# Patient Record
Sex: Female | Born: 2000 | Race: White | Hispanic: No | Marital: Single | Smoking: Never smoker
Health system: Southern US, Community
[De-identification: ages and names within clinical notes are randomized; demographics above are authoritative.]

## PROBLEM LIST (undated history)

## (undated) DIAGNOSIS — J111 Influenza due to unidentified influenza virus with other respiratory manifestations: Secondary | ICD-10-CM

---
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
## 2018-04-07 ENCOUNTER — Encounter (HOSPITAL_COMMUNITY): Payer: Self-pay | Admitting: Emergency Medicine

## 2018-04-07 ENCOUNTER — Ambulatory Visit (HOSPITAL_COMMUNITY)
Admission: EM | Admit: 2018-04-07 | Discharge: 2018-04-07 | Disposition: A | Discharge status: Home / Self Care | Payer: No Typology Code available for payment source | Attending: Emergency Medicine | Admitting: Emergency Medicine

## 2018-04-07 ENCOUNTER — Other Ambulatory Visit: Payer: Self-pay

## 2018-04-07 DIAGNOSIS — K644 Residual hemorrhoidal skin tags: Secondary | ICD-10-CM

## 2018-04-07 MED ORDER — POLYETHYLENE GLYCOL 3350 17 G PO PACK: 17.0000 g | PACK | Freq: Every day | ORAL | 0 refills | Status: DC

## 2018-04-07 MED ORDER — HYDROCORTISONE 2.5 % RE CREA: TOPICAL_CREAM | RECTAL | 0 refills | Status: DC

## 2018-04-07 NOTE — Discharge Instructions (Signed)
External hemorrhoid seen on exam. Start miralax to soften stool and help with better bowel movement. Anusol as needed. Keep hydrated, your urine should be clear to pale yellow in color. Follow up with PCP if symptoms not improving for reevaluation. If experiencing worsening bleeding, weakness, dizziness, lightheadedness, go to the emergency department for further evaluation needed.

## 2018-04-07 NOTE — ED Triage Notes (Signed)
[facility] a complaint of rectal bleeding x 4 months that she believed to be a hemorrhoid.

## 2018-04-07 NOTE — ED Provider Notes (Signed)
[facility]    CSN: [id number] Arrival date & time: 04/07/18  1628     History   Chief Complaint Chief Complaint  Patient presents with  . Rectal Bleeding    HPI Lysle Dingwallbigail Pikus is a 17 y.o. female.   17 year old female comes in for 4 months history of rectal bleeding.  States she came in today because [place] 2 [place], and was told to come in.  Denies any worsening of symptoms.  States that she has bright red blood in the toilet bowl every time she has a bowel movement, with some blood with wiping.  States some pain with bowel movement, and usually with hard round stools.  She has bowel movement about 2-3 times a week.  Denies abdominal pain, nausea, vomiting.  Denies history of irritable bowel.  Denies weakness, dizziness, syncope, lightheadedness.  Denies alcohol use, chronic NSAID use.  Has not done anything for the symptoms.     Past Medical History:  Diagnosis Date  . Influenza     There are no active problems to display for this patient.   History reviewed. No pertinent surgical history.  OB History   None      Home Medications    Prior to Admission medications   Medication Sig Start Date End Date Taking? Authorizing Provider  hydrocortisone (ANUSOL-HC) 2.5 % rectal cream Apply rectally 2 times daily 04/07/18   Cathie HoopsYu, Jeweline Reif V, PA-C  polyethylene glycol (MIRALAX) packet Take 17 g by mouth daily. 04/07/18   Belinda FisherYu, Khiree Bukhari V, PA-C    Family History History reviewed. No pertinent family history.  Social History Social History   Tobacco Use  . Smoking status: Never Smoker  Substance Use Topics  . Alcohol use: No  . Drug use: No     Allergies   Patient has no known allergies.   Review of Systems Review of Systems  Reason unable to perform ROS: See HPI as above.     Physical Exam Triage Vital Signs ED Triage Vitals  Enc Vitals Group     BP 04/07/18 1637 122/78     Pulse Rate 04/07/18 1637 81     Resp 04/07/18  1637 18     Temp 04/07/18 1637 98.3 F (36.8 C)     Temp Source 04/07/18 1637 Oral     SpO2 04/07/18 1637 100 %     Weight --      Height --      Head Circumference --      Peak Flow --      Pain Score 04/07/18 1638 1     Pain Loc --      Pain Edu? --      Excl. in GC? --    No data found.  Updated Vital Signs BP 122/78 (BP Location: Right Arm)   Pulse 81   Temp 98.3 F (36.8 C) (Oral)   Resp 18   LMP 03/24/2018   SpO2 100%   Physical Exam  Constitutional: She is oriented to person, place, and time. She appears well-developed and well-nourished. No distress.  HENT:  Head: Normocephalic and atraumatic.  Mouth/Throat: Uvula is midline, oropharynx is clear and moist and mucous membranes are normal. Mucous membranes are not pale.  Eyes: Pupils are equal, round, and reactive to light. Conjunctivae are normal.  Genitourinary:  Genitourinary Comments: Nonthrombosed external hemorrhoids noted on exam. Rectal tone normal. Mild tenderness to palpation at 6 o'clock region. No  obvious internal hemorrhoids felt. No internal hemorrhoids visualized with bearing down.   Neurological: She is alert and oriented to person, place, and time.  Skin: Skin is warm and dry. No pallor.     UC Treatments / Results  Labs (all labs ordered are listed, but only abnormal results are displayed) Labs Reviewed - No data to display  EKG None  Radiology No results found.  Procedures Procedures (including critical care time)  Medications Ordered in UC Medications - No data to display  Initial Impression / Assessment and Plan / UC Course  I have reviewed the triage vital signs and the nursing notes.  Pertinent labs & imaging results that were available during my care of the patient were reviewed by me and considered in my medical decision making (see chart for details).    Nonthrombosed external hemorrhoids. Will have patient use anusol as needed. miralax to soften stool. Information on  hemorrhoids provided. Return precautions given. Patient expresses understanding and agrees to plan.  Final Clinical Impressions(s) / UC Diagnoses   Final diagnoses:  External hemorrhoid    ED Prescriptions    Medication Sig Dispense Auth. Provider   hydrocortisone (ANUSOL-HC) 2.5 % rectal cream Apply rectally 2 times daily 28.35 g Fadia Marlar V, PA-C   polyethylene glycol (MIRALAX) packet Take 17 g by mouth daily. [address] each Threasa Alpha, PA-C 04/07/18 1749

## 2018-04-30 ENCOUNTER — Ambulatory Visit (HOSPITAL_COMMUNITY)
Admission: EM | Admit: 2018-04-30 | Discharge: 2018-04-30 | Disposition: A | Discharge status: Home / Self Care | Payer: No Typology Code available for payment source | Attending: Family Medicine | Admitting: Family Medicine

## 2018-04-30 ENCOUNTER — Other Ambulatory Visit: Payer: Self-pay

## 2018-04-30 ENCOUNTER — Encounter (HOSPITAL_COMMUNITY): Payer: Self-pay | Admitting: Emergency Medicine

## 2018-04-30 DIAGNOSIS — Z3202 Encounter for pregnancy test, result negative: Secondary | ICD-10-CM

## 2018-04-30 DIAGNOSIS — Z1389 Encounter for screening for other disorder: Secondary | ICD-10-CM

## 2018-04-30 DIAGNOSIS — K219 Gastro-esophageal reflux disease without esophagitis: Secondary | ICD-10-CM

## 2018-04-30 LAB — POCT URINALYSIS DIP (DEVICE)
Bilirubin Urine: NEGATIVE
Glucose, UA: NEGATIVE mg/dL
Hgb urine dipstick: NEGATIVE
Ketones, ur: NEGATIVE mg/dL
Leukocytes, UA: NEGATIVE
Nitrite: NEGATIVE
Protein, ur: NEGATIVE mg/dL
Specific Gravity, Urine: 1.015 (ref 1.005–1.030)
Urobilinogen, UA: 0.2 mg/dL (ref 0.0–1.0)
pH: 8 (ref 5.0–8.0)

## 2018-04-30 LAB — POCT PREGNANCY, URINE: Preg Test, Ur: NEGATIVE

## 2018-04-30 MED ORDER — OMEPRAZOLE 20 MG PO CPDR: 20.0000 mg | DELAYED_RELEASE_CAPSULE | Freq: Every day | ORAL | 1 refills | Status: DC

## 2018-04-30 NOTE — Discharge Instructions (Signed)
Call Dr. Russella DarStark for appointment.

## 2018-04-30 NOTE — ED Provider Notes (Signed)
[facility]   [id number] 04/30/18 Arrival Time: 1548   SUBJECTIVE:  Kathleen Lam is a 17 y.o. female who presents to the urgent care with complaint of Intermittent vomiting for 2 months. Patient has vomited multiple times this week.vomiting usually follows a meal and may be 4-5 episodes of vomiting post- meal  Patient denies abdominal pain or nausea.  The emesis is preceded by eating.  She gets a sense of needing to burp, and a mouthful of food comes up with the gas  She tried eliminating dairy which did not help.  She has not tried antacids.  Denies possibility of pregancy and is having her menses now.  [employer] student in communications.  Past Medical History:  Diagnosis Date  . Influenza    Family History  Problem Relation Age of Onset  . Healthy Mother   . Healthy Father    Social History   Socioeconomic History  . Marital status: Single    Spouse name: Not on file  . Number of children: Not on file  . Years of education: Not on file  . Highest education level: Not on file  Occupational History  . Not on file  Social Needs  . Financial resource strain: Not on file  . Food insecurity:    Worry: Not on file    Inability: Not on file  . Transportation needs:    Medical: Not on file    Non-medical: Not on file  Tobacco Use  . Smoking status: Never Smoker  Substance and Sexual Activity  . Alcohol use: No  . Drug use: No  . Sexual activity: Never    Birth control/protection: None  Lifestyle  . Physical activity:    Days per week: Not on file    Minutes per session: Not on file  . Stress: Not on file  Relationships  . Social connections:    Talks on phone: Not on file    Gets together: Not on file    Attends religious service: Not on file    Active member of club or organization: Not on file    Attends meetings of clubs or organizations: Not on file    Relationship status: Not on file  . Intimate partner violence:    Fear of current or ex  partner: Not on file    Emotionally abused: Not on file    Physically abused: Not on file    Forced sexual activity: Not on file  Other Topics Concern  . Not on file  Social History Narrative  . Not on file   No outpatient medications have been marked as taking for the 04/30/18 encounter [facility] Encounter).   No Known Allergies    ROS: As per HPI, remainder of ROS negative.   OBJECTIVE:   Vitals:   04/30/18 1615  BP: 108/79  Pulse: 90  Resp: 18  Temp: 98.4 F (36.9 C)  TempSrc: Oral  SpO2: 97%     General appearance: alert; no distress; cheerful as though nothing is wrong Eyes: PERRL; EOMI; conjunctiva normal HENT: normocephalic; atraumatic;oral mucosa normal Neck: supple Lungs: clear to auscultation bilaterally Heart: regular rate and rhythm Abdomen: soft, non-tender; bowel sounds normal; no masses or organomegaly; no guarding or rebound tenderness Back: no CVA tenderness Extremities: no cyanosis or edema; symmetrical with no gross deformities Skin: warm and dry Neurologic: normal gait; grossly normal Psychological: alert and cooperative; normal mood and affect      Labs:  Results for orders placed or performed during  the hospital encounter of 04/30/18  POCT urinalysis dip (device)  Result Value Ref Range   Glucose, UA NEGATIVE NEGATIVE mg/dL   Bilirubin Urine NEGATIVE NEGATIVE   Ketones, ur NEGATIVE NEGATIVE mg/dL   Specific Gravity, Urine 1.015 1.005 - 1.030   Hgb urine dipstick NEGATIVE NEGATIVE   pH 8.0 5.0 - 8.0   Protein, ur NEGATIVE NEGATIVE mg/dL   Urobilinogen, UA 0.2 0.0 - 1.0 mg/dL   Nitrite NEGATIVE NEGATIVE   Leukocytes, UA NEGATIVE NEGATIVE  Pregnancy, urine POC  Result Value Ref Range   Preg Test, Ur NEGATIVE NEGATIVE    Labs Reviewed  POCT URINALYSIS DIP (DEVICE)  POCT PREGNANCY, URINE    No results found.     ASSESSMENT & PLAN:  1. Gastroesophageal reflux disease, esophagitis presence not specified     Meds ordered  this encounter  Medications  . omeprazole (PRILOSEC) 20 MG capsule    Sig: Take 1 capsule (20 mg total) by mouth daily.    Dispense:  30 capsule    Refill:  1    Reviewed expectations re: course of current medical issues. Questions answered. Outlined signs and symptoms indicating need for more acute intervention. Patient verbalized understanding. After Visit Summary given.    Procedures:      Elvina SidleLauenstein, Melat Wrisley, MD 04/30/18 [phone]

## 2018-04-30 NOTE — ED Triage Notes (Signed)
Intermittent vomiting for 2 months. Patient has vomited multiple times this week.vomiting usually follows a meal and may be 4-5 episodes of vomiting post- meal

## 2018-04-30 NOTE — ED Notes (Signed)
Placed urine specimen in lab 

## 2018-05-12 ENCOUNTER — Other Ambulatory Visit: Payer: Self-pay

## 2018-05-12 ENCOUNTER — Encounter (HOSPITAL_COMMUNITY): Payer: Self-pay | Admitting: Emergency Medicine

## 2018-05-12 ENCOUNTER — Emergency Department (HOSPITAL_COMMUNITY)
Admission: EM | Admit: 2018-05-12 | Discharge: 2018-05-13 | Disposition: A | Discharge status: Home / Self Care | Payer: No Typology Code available for payment source | Attending: Emergency Medicine | Admitting: Emergency Medicine

## 2018-05-12 DIAGNOSIS — Z79899 Other long term (current) drug therapy: Secondary | ICD-10-CM | POA: Diagnosis not present

## 2018-05-12 DIAGNOSIS — R112 Nausea with vomiting, unspecified: Secondary | ICD-10-CM | POA: Diagnosis not present

## 2018-05-12 DIAGNOSIS — K8051 Calculus of bile duct without cholangitis or cholecystitis with obstruction: Secondary | ICD-10-CM | POA: Insufficient documentation

## 2018-05-12 DIAGNOSIS — K8050 Calculus of bile duct without cholangitis or cholecystitis without obstruction: Secondary | ICD-10-CM

## 2018-05-12 DIAGNOSIS — R109 Unspecified abdominal pain: Secondary | ICD-10-CM | POA: Diagnosis present

## 2018-05-12 NOTE — ED Triage Notes (Signed)
Patient presents complaining of emesis and abdominal pain after eating after the last few months. Patient seen end of June for same. Patient has a GI appointment scheduled for next Friday but her friend felt she needed to be seen. Emesis is sporadic, no patterns of vomiting after certain foods.

## 2018-05-13 LAB — CBC
HCT: 36 % (ref 36.0–46.0)
Hemoglobin: 11.3 g/dL — ABNORMAL LOW (ref 12.0–15.0)
MCH: 24.6 pg — ABNORMAL LOW (ref 26.0–34.0)
MCHC: 31.4 g/dL (ref 30.0–36.0)
MCV: 78.3 fL (ref 78.0–100.0)
Platelets: 384 10*3/uL (ref 150–400)
RBC: 4.6 MIL/uL (ref 3.87–5.11)
RDW: 14.2 % (ref 11.5–15.5)
WBC: 5.8 10*3/uL (ref 4.0–10.5)

## 2018-05-13 LAB — COMPREHENSIVE METABOLIC PANEL
ALT: 11 U/L (ref 0–44)
AST: 20 U/L (ref 15–41)
Albumin: 4.4 g/dL (ref 3.5–5.0)
Alkaline Phosphatase: 60 U/L (ref 38–126)
Anion gap: 10 (ref 5–15)
BUN: 11 mg/dL (ref 6–20)
CO2: 23 mmol/L (ref 22–32)
Calcium: 9.2 mg/dL (ref 8.9–10.3)
Chloride: 107 mmol/L (ref 98–111)
Creatinine, Ser: 0.6 mg/dL (ref 0.44–1.00)
GFR calc Af Amer: >60 mL/min (ref >60)
GFR calc non Af Amer: >60 mL/min (ref >60)
Glucose, Bld: 82 mg/dL (ref 70–99)
Potassium: 4.1 mmol/L (ref 3.5–5.1)
Sodium: 140 mmol/L (ref 135–145)
Total Bilirubin: 0.4 mg/dL (ref 0.3–1.2)
Total Protein: 7.6 g/dL (ref 6.5–8.1)

## 2018-05-13 LAB — URINALYSIS, ROUTINE W REFLEX MICROSCOPIC
Bilirubin Urine: NEGATIVE
Glucose, UA: NEGATIVE mg/dL
Hgb urine dipstick: NEGATIVE
Ketones, ur: NEGATIVE mg/dL
Leukocytes, UA: NEGATIVE
Nitrite: NEGATIVE
Protein, ur: NEGATIVE mg/dL
Specific Gravity, Urine: 1.014 (ref 1.005–1.030)
pH: 7 (ref 5.0–8.0)

## 2018-05-13 LAB — I-STAT CG4 LACTIC ACID, ED: Lactic Acid, Venous: 1.01 mmol/L (ref 0.5–1.9)

## 2018-05-13 LAB — POC URINE PREG, ED: Preg Test, Ur: NEGATIVE

## 2018-05-13 LAB — LIPASE, BLOOD: Lipase: 29 U/L (ref 11–51)

## 2018-05-13 MED ORDER — ONDANSETRON 4 MG PO TBDP: 4.0000 mg | ORAL_TABLET | Freq: Three times a day (TID) | ORAL | 0 refills | Status: DC | PRN

## 2018-05-13 NOTE — Discharge Instructions (Signed)
Thank you for allowing me to care for you today in the Emergency Department.   Please keep your appointment with gastroenterology in a week and a half.  Take 600 mg of ibuprofen with food or 650 mg of Tylenol every 6 hours as needed for pain control.  Let 1 tablet of Zofran dissolve under your tongue every 8 hours as needed for nausea or vomiting.  Return to the emergency department if you develop high fever, persistent vomiting despite taking Zofran, severe, uncontrollable pain, limegreen or blood in your vomit, or other new, concerning symptoms.

## 2018-05-13 NOTE — ED Notes (Signed)
Rn attempted blood draw x1 without success.

## 2018-05-13 NOTE — ED Provider Notes (Signed)
[facility] DEPT Provider Note   CSN: [id number] Arrival date & time: 05/12/18  2015     History   Chief Complaint Chief Complaint  Patient presents with  . Abdominal Pain  . Emesis    HPI Kathleen Lam is a 17 y.o. female presents to the emergency department with a chief complaint of vomiting.  The patient endorses nonbloody, nonbilious emesis with associated abdominal pain and nausea for the last 6 weeks.  She reports that she has been having approximately 3-4 episodes and sometimes 10-12 episodes of emesis daily since onset.  She reports associated constant, 2/10 abdominal pain that will intermittently become severely crampy or sharp.  She reports that sometimes the pain is in the right upper quadrant and sometimes it is in the epigastric or left upper quadrant.  She states that sometimes it that she will have episodes of vomiting after eating, but other times she will have episodes after she has not eaten for hours.  Known aggravating or alleviating factors for her abdominal pain.  She reports that she was seen for similar symptoms at urgent care 2 weeks ago and was discharged with a prescription of omeprazole.  She reports that she has been taking this daily without improvement in her symptoms.  She was also referred to gastroenterology, but is unable to get an appointment for a week and a half.  She denies fever, chills, hematemesis, projectile vomiting, constipation, diarrhea, back pain, chest pain, dyspnea, dysuria, hematuria, vaginal pain, itching, or discharge, headache, dizziness, lightheadedness, melena, or hematochezia.  No history of abdominal surgery.  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  She was previously seeing a counselor, but has not been seen in several months since her symptoms have resolved.  She is not sexually active.  LMP was 2 weeks  ago.  No known sick contacts.   The history is provided by the patient. No language interpreter was used.  Abdominal Pain   Associated symptoms include vomiting. Pertinent negatives include dysuria and headaches.  Emesis   Associated symptoms include abdominal pain. Pertinent negatives include no headaches.    Past Medical History:  Diagnosis Date  . Influenza     There are no active problems to display for this patient.   History reviewed. No pertinent surgical history.   OB History   None      Home Medications    Prior to Admission medications   Medication Sig Start Date End Date Taking? Authorizing Provider  omeprazole (PRILOSEC) 20 MG capsule Take 1 capsule (20 mg total) by mouth daily. 04/30/18  Yes Robyn Haber, MD  ondansetron (ZOFRAN ODT) 4 MG disintegrating tablet Take 1 tablet (4 mg total) by mouth every 8 (eight) hours as needed for nausea or vomiting. 05/13/18   Stiven Kaspar A, PA-C    Family History Family History  Problem Relation Age of Onset  . Healthy Mother   . Healthy Father     Social History Social History   Tobacco Use  . Smoking status: Never Smoker  . Smokeless tobacco: Never Used  Substance Use Topics  . Alcohol use: No  . Drug use: No     Allergies   Patient has no known allergies.   Review of Systems Review of Systems  Constitutional: Negative for activity change.  Respiratory: Negative for shortness of breath.   Cardiovascular: Negative for chest pain.  Gastrointestinal: Positive for abdominal  pain and vomiting.  Genitourinary: Negative for dysuria.  Musculoskeletal: Negative for back pain.  Skin: Negative for rash.  Allergic/Immunologic: Negative for immunocompromised state.  Neurological: Negative for headaches.  Psychiatric/Behavioral: Negative for confusion.   Physical Exam Updated Vital Signs BP 100/63 (BP Location: Left Arm)   Pulse 60   Temp 98 F (36.7 C) (Oral)   Resp 18   Ht _0  (1.626 m)   Wt  81.6 kg (180 lb)   LMP 04/29/2018   SpO2 100%   BMI 30.90 kg/m   Physical Exam  Constitutional: She appears well-developed and well-nourished. No distress.  HENT:  Head: Normocephalic.  Eyes: Conjunctivae are normal. No scleral icterus.  Neck: Neck supple.  Cardiovascular: Normal rate and regular rhythm. Exam reveals no gallop and no friction rub.  No murmur heard. Pulmonary/Chest: Effort normal. No stridor. No respiratory distress. She has no wheezes. She has no rales. She exhibits no tenderness.  Abdominal: Soft. Bowel sounds are normal. She exhibits no distension and no mass. There is tenderness. There is no rebound and no guarding. No hernia.  Minimally tender to palpation in the right upper quadrant.  Abdomen is soft, nondistended.  Remainder of the abdominal exam is unremarkable.  Negative Murphy sign.  No CVA tenderness bilaterally.  No peritoneal signs.  Musculoskeletal: Normal range of motion. She exhibits no edema, tenderness or deformity.  Neurological: She is alert.  Skin: Skin is warm. No rash noted. She is not diaphoretic.  Psychiatric: Her behavior is normal.  Nursing note and vitals reviewed.   ED Treatments / Results  Labs (all labs ordered are listed, but only abnormal results are displayed) Labs Reviewed  CBC - Abnormal; Notable for the following components:      Result Value   Hemoglobin 11.3 (*)    MCH 24.6 (*)    All other components within normal limits  COMPREHENSIVE METABOLIC PANEL  LIPASE, BLOOD  URINALYSIS, ROUTINE W REFLEX MICROSCOPIC  I-STAT CG4 LACTIC ACID, ED  I-STAT CG4 LACTIC ACID, ED  POC URINE PREG, ED    EKG None  Radiology No results found.  Procedures Procedures (including critical care time)  Medications Ordered in ED Medications - No data to display   Initial Impression / Assessment and Plan / ED Course  I have reviewed the triage vital signs and the nursing notes.  Pertinent labs & imaging results that were available  during my care of the patient were reviewed by me and considered in my medical decision making (see chart for details).     17 year old female with a history of bulimia in remission presenting with daily emesis and upper abdominal pain for the last 6 weeks.  She was previously seen in urgent care about 2 weeks ago and was discharged with omeprazole with no improvement in her symptoms.  She is advised to follow-up with GI.  Her initial consult is scheduled for 1.5 weeks from today.   Labs are reassuring with no leukocytosis, normal transaminases, total bilirubin, and alk phos.  Electrolytes are normal despite daily emesis.  UA is unremarkable.  Pregnancy test is negative.  At this time I feel that no further urgent or emergent work-up is indicated.  Patient declines antiemetic at this time as she currently does not have any nausea.  Declines ibuprofen or Tylenol for pain control.  Suspect biliary colic versus psychosomatic symptoms secondary to increased stress.  Doubt tubo-ovarian abscess, nephrolithiasis, cholecystitis, choledocholithiasis, pancreatitis, appendicitis, or diverticulitis.  Will discharge home with a short  course of Zofran.  Strict return precautions given.  Patient is hemodynamically stable and in no acute distress.  She is safe for discharge home with outpatient follow-up at this time.  Final Clinical Impressions(s) / ED Diagnoses   Final diagnoses:  Biliary colic  Non-intractable vomiting with nausea, unspecified vomiting type    ED Discharge Orders        Ordered    ondansetron (ZOFRAN ODT) 4 MG disintegrating tablet  Every 8 hours PRN     05/13/18 0310       Amjad Fikes A, PA-C 05/13/18 9409    Ripley Fraise, MD 05/14/18 0004

## 2018-07-01 ENCOUNTER — Other Ambulatory Visit: Payer: Self-pay

## 2018-07-01 ENCOUNTER — Encounter (HOSPITAL_COMMUNITY): Payer: Self-pay | Admitting: Emergency Medicine

## 2018-07-01 ENCOUNTER — Ambulatory Visit (HOSPITAL_COMMUNITY)
Admission: EM | Admit: 2018-07-01 | Discharge: 2018-07-01 | Disposition: A | Discharge status: Home / Self Care | Payer: No Typology Code available for payment source | Attending: Family Medicine | Admitting: Family Medicine

## 2018-07-01 DIAGNOSIS — J029 Acute pharyngitis, unspecified: Secondary | ICD-10-CM | POA: Diagnosis present

## 2018-07-01 DIAGNOSIS — R509 Fever, unspecified: Secondary | ICD-10-CM

## 2018-07-01 DIAGNOSIS — J028 Acute pharyngitis due to other specified organisms: Secondary | ICD-10-CM | POA: Diagnosis not present

## 2018-07-01 LAB — POCT INFECTIOUS MONO SCREEN: Mono Screen: NEGATIVE

## 2018-07-01 LAB — POCT RAPID STREP A: Streptococcus, Group A Screen (Direct): NEGATIVE

## 2018-07-01 MED ORDER — AMOXICILLIN 500 MG PO CAPS: 500.0000 mg | ORAL_CAPSULE | Freq: Two times a day (BID) | ORAL | 0 refills | Status: AC

## 2018-07-01 NOTE — ED Triage Notes (Signed)
Sore throat started Monday.  Patient reports 101 fever at that time.  Yesterday sore throat worsened.

## 2018-07-01 NOTE — Discharge Instructions (Signed)
Strep test negative, will send out for culture and we will call you with results Mono negative Get plenty of rest and push fluids Amoxicillin prescribed.  Take as directed and to completion Follow up with PCP if symptoms persists Return or go to ER if patient has any new or worsening symptoms

## 2018-07-01 NOTE — ED Provider Notes (Signed)
[facility]   [id number] 07/01/18 Arrival Time: [id number]  FA:OZHYCC:SORE THROAT  SUBJECTIVE: History from: patient.  Kathleen Dingwallbigail Raine is a 17 y.o. female who presents with abrupt onset of sore throat that began 3 days ago.  Denies positive sick exposure or precipitating event.  Has tried ibuprofen and dayquil without relief.  Symptoms are made worse with swallowing, but tolerating own secretions without difficulty.  Reports previous symptoms in the past and diagnosed with strep.   Complains of fever with Tmax of 101 at home, chills, decreased appetite, fatigue, and ear pressure. Denies sinus pain, rhinorrhea, cough, SOB, wheezing, chest pain, nausea, rash, changes in bowel or bladder habits.    ROS: As per HPI.  Past Medical History:  Diagnosis Date  . Influenza    History reviewed. No pertinent surgical history. No Known Allergies No current facility-administered medications on file prior to encounter.    Current Outpatient Medications on File Prior to Encounter  Medication Sig Dispense Refill  . acetaminophen (TYLENOL) 325 MG tablet Take 650 mg by mouth every 6 (six) hours as needed.     Social History   Socioeconomic History  . Marital status: Single    Spouse name: Not on file  . Number of children: Not on file  . Years of education: Not on file  . Highest education level: Not on file  Occupational History  . Not on file  Social Needs  . Financial resource strain: Not on file  . Food insecurity:    Worry: Not on file    Inability: Not on file  . Transportation needs:    Medical: Not on file    Non-medical: Not on file  Tobacco Use  . Smoking status: Never Smoker  . Smokeless tobacco: Never Used  Substance and Sexual Activity  . Alcohol use: No  . Drug use: No  . Sexual activity: Never    Birth control/protection: None  Lifestyle  . Physical activity:    Days per week: Not on file    Minutes per session: Not on file  . Stress: Not on file  Relationships  .  Social connections:    Talks on phone: Not on file    Gets together: Not on file    Attends religious service: Not on file    Active member of club or organization: Not on file    Attends meetings of clubs or organizations: Not on file    Relationship status: Not on file  . Intimate partner violence:    Fear of current or ex partner: Not on file    Emotionally abused: Not on file    Physically abused: Not on file    Forced sexual activity: Not on file  Other Topics Concern  . Not on file  Social History Narrative  . Not on file   Family History  Problem Relation Age of Onset  . Healthy Mother   . Healthy Father     OBJECTIVE:  Vitals:   07/01/18 0846  BP: 113/73  Pulse: 94  Resp: 16  Temp: 98.5 F (36.9 C)  TempSrc: Oral  SpO2: 96%     General appearance: alert; appears fatigued HEENT: Ears: EACs clear with mild cerumen, TMs pearly gray with visible cone of light, without erythema; Eyes: PERRL, EOMI grossly; Sinuses nontender to palpation; Nose: clear rhinorrhea; Throat: oropharynx mildly erythematous, tonsils 1+ with white tonsillar exudates, uvula midline Neck: anterior cervical LAD Lungs: CTA bilaterally without adventitious breath sounds Heart: regular rate and rhythm.  Radial pulses 2+ symmetrical bilaterally Skin: warm and dry Psychological: alert and cooperative; normal mood and affect appropriate for age  Labs: Results for orders placed or performed during the hospital encounter of 07/01/18 (from the past 24 hour(s))  POCT rapid strep A [facility])     Status: None   Collection Time: 07/01/18  9:02 AM  Result Value Ref Range   Streptococcus, Group A Screen (Direct) NEGATIVE NEGATIVE  Infectious mono screen, POC     Status: None   Collection Time: 07/01/18  9:52 AM  Result Value Ref Range   Mono Screen NEGATIVE NEGATIVE     ASSESSMENT & PLAN:  1. Acute pharyngitis due to other specified organisms     Meds ordered this encounter  Medications    . amoxicillin (AMOXIL) 500 MG capsule    Sig: Take 1 capsule (500 mg total) by mouth 2 (two) times daily for 10 days.    Dispense:  20 capsule    Refill:  0    Order Specific Question:   Supervising Provider    Answer:   Isa Rankin [[id number]]   Strep test negative, will send out for culture and we will call you with results Mono negative Get plenty of rest and push fluids Amoxicillin prescribed.  Take as directed and to completion Follow up with PCP if symptoms persists Return or go to ER if patient has any new or worsening symptoms   Reviewed expectations re: course of current medical issues. Questions answered. Outlined signs and symptoms indicating need for more acute intervention. Patient verbalized understanding. After Visit Summary given.        Rennis Harding, PA-C 07/01/18 1002

## 2018-07-03 LAB — CULTURE, GROUP A STREP (THRC)

## 2018-07-06 ENCOUNTER — Telehealth (HOSPITAL_COMMUNITY): Payer: Self-pay

## 2018-07-06 NOTE — Telephone Encounter (Signed)
Culture is positive for non group A Strep germ.  This is a finding of uncertain significance; not the typical 'strep throat' germ.  Pt was treated with Amoxicillin. Pt called and made aware.

## 2018-08-19 ENCOUNTER — Emergency Department (HOSPITAL_COMMUNITY)
Admission: EM | Admit: 2018-08-19 | Discharge: 2018-08-20 | Disposition: A | Discharge status: Home / Self Care | Payer: Medicaid Other | Attending: Emergency Medicine | Admitting: Emergency Medicine

## 2018-08-19 ENCOUNTER — Encounter (HOSPITAL_COMMUNITY): Payer: Self-pay

## 2018-08-19 ENCOUNTER — Other Ambulatory Visit: Payer: Self-pay

## 2018-08-19 DIAGNOSIS — K920 Hematemesis: Secondary | ICD-10-CM | POA: Insufficient documentation

## 2018-08-19 DIAGNOSIS — R1013 Epigastric pain: Secondary | ICD-10-CM | POA: Diagnosis not present

## 2018-08-19 DIAGNOSIS — Z79899 Other long term (current) drug therapy: Secondary | ICD-10-CM | POA: Insufficient documentation

## 2018-08-19 DIAGNOSIS — R109 Unspecified abdominal pain: Secondary | ICD-10-CM

## 2018-08-19 LAB — COMPREHENSIVE METABOLIC PANEL
ALBUMIN: 4.4 g/dL (ref 3.5–5.0)
ALT: 13 U/L (ref 0–44)
ANION GAP: 14 (ref 5–15)
AST: 20 U/L (ref 15–41)
Alkaline Phosphatase: 55 U/L (ref 38–126)
BUN: 10 mg/dL (ref 6–20)
CALCIUM: 9.8 mg/dL (ref 8.9–10.3)
CO2: 20 mmol/L — AB (ref 22–32)
CREATININE: 0.71 mg/dL (ref 0.44–1.00)
Chloride: 108 mmol/L (ref 98–111)
GFR calc Af Amer: >60 mL/min (ref >60)
GFR calc non Af Amer: >60 mL/min (ref >60)
GLUCOSE: 111 mg/dL — AB (ref 70–99)
Potassium: 4.1 mmol/L (ref 3.5–5.1)
SODIUM: 142 mmol/L (ref 135–145)
Total Bilirubin: 0.4 mg/dL (ref 0.3–1.2)
Total Protein: 7.4 g/dL (ref 6.5–8.1)

## 2018-08-19 LAB — CBC
HCT: 42.3 % (ref 36.0–46.0)
Hemoglobin: 11.8 g/dL — ABNORMAL LOW (ref 12.0–15.0)
MCH: 24.9 pg — AB (ref 26.0–34.0)
MCHC: 27.9 g/dL — ABNORMAL LOW (ref 30.0–36.0)
MCV: 89.2 fL (ref 80.0–100.0)
PLATELETS: 354 10*3/uL (ref 150–400)
RBC: 4.74 MIL/uL (ref 3.87–5.11)
RDW: 15.6 % — ABNORMAL HIGH (ref 11.5–15.5)
WBC: 7.7 10*3/uL (ref 4.0–10.5)
nRBC: 0 % (ref 0.0–0.2)

## 2018-08-19 LAB — I-STAT BETA HCG BLOOD, ED (MC, WL, AP ONLY): I-stat hCG, quantitative: <5 m[IU]/mL (ref <5)

## 2018-08-19 LAB — LIPASE, BLOOD: Lipase: 37 U/L (ref 11–51)

## 2018-08-19 MED ORDER — ONDANSETRON 4 MG PO TBDP: 4.0000 mg | ORAL_TABLET | Freq: Three times a day (TID) | ORAL | 0 refills | Status: DC | PRN

## 2018-08-19 MED ORDER — FAMOTIDINE 20 MG PO TABS: 20.0000 mg | ORAL_TABLET | Freq: Two times a day (BID) | ORAL | 0 refills | Status: AC

## 2018-08-19 MED ORDER — GI COCKTAIL ~~LOC~~
30.0000 mL | Freq: Once | ORAL | Status: AC
  Administered 2018-08-19: 30 mL via ORAL
  Filled 2018-08-19: qty 30

## 2018-08-19 MED ORDER — SODIUM CHLORIDE 0.9 % IV BOLUS
1000.0000 mL | Freq: Once | INTRAVENOUS | Status: AC
  Administered 2018-08-19: 1000 mL via INTRAVENOUS

## 2018-08-19 MED ORDER — FAMOTIDINE IN NACL 20-0.9 MG/50ML-% IV SOLN
20.0000 mg | Freq: Once | INTRAVENOUS | Status: AC
  Administered 2018-08-19: 20 mg via INTRAVENOUS
  Filled 2018-08-19: qty 50

## 2018-08-19 NOTE — Discharge Instructions (Signed)
1. Medications: Take Pepcid daily with food. Take Zofran as needed for nausea.  Wait around 20 minutes before eating or drinking after taking this medication. 2. Treatment: rest, drink plenty of fluids, advance diet slowly.  Eat a diet of bland foods that will not upset your stomach.  Avoid spicy foods, fried foods, alcohol, or fatty foods. 3. Follow Up: Follow-up with your gastroenterologist as scheduled tomorrow; Please return to the ER for persistent vomiting, high fevers or worsening symptoms

## 2018-08-19 NOTE — ED Provider Notes (Signed)
[facility]-EMERGENCY DEPT Provider Note   CSN: [id number] Arrival date & time: 08/19/18  1950     History   Chief Complaint Chief Complaint  Patient presents with  . Hematemesis    HPI Kathleen Lam is a 17 y.o. female presents today for evaluation of acute onset, intermittent hematemesis.  She has been experiencing multiple episodes of emesis daily for several months.  She has been seeing Dr. Evette Cristal with gastroenterology and states she was told she had a hiatal hernia.  She states she is scheduled for repair in December.  She notes at least 6 episodes of emesis daily, mostly postprandial.  She does at times hematemesis does not though usually not.  Notes intermittent upper abdominal pain which is sharp and stabbing, occurs sometimes after meals or at night.  She states she is not currently taking anything for her symptoms because nothing she has taken has been helpful.  Denies fevers, chills, chest pain, shortness of breath, urinary symptoms, vaginal itching, bleeding, or discharge.  No melena or hematochezia.  Presents today after having some blood-streaked emesis after drinking a chocolate milkshake.  The history is provided by the patient.    Past Medical History:  Diagnosis Date  . Influenza     There are no active problems to display for this patient.   History reviewed. No pertinent surgical history.   OB History   None      Home Medications    Prior to Admission medications   Medication Sig Start Date End Date Taking? Authorizing Provider  Multiple Vitamin (MULTIVITAMIN WITH MINERALS) TABS tablet Take 1 tablet by mouth daily.   Yes [provider]  famotidine (PEPCID) 20 MG tablet Take 1 tablet (20 mg total) by mouth 2 (two) times daily. 08/19/18   Foch Rosenwald A, PA-C  ondansetron (ZOFRAN ODT) 4 MG disintegrating tablet Take 1 tablet (4 mg total) by mouth every 8 (eight) hours as needed for nausea or vomiting. 08/19/18   Jeanie Sewer, PA-C    Family History Family History  Problem Relation Age of Onset  . Healthy Mother   . Healthy Father     Social History Social History   Tobacco Use  . Smoking status: Never Smoker  . Smokeless tobacco: Never Used  Substance Use Topics  . Alcohol use: No  . Drug use: No     Allergies   Patient has no known allergies.   Review of Systems Review of Systems  Constitutional: Negative for chills and fever.  Gastrointestinal: Positive for abdominal pain, nausea and vomiting. Negative for blood in stool, constipation and diarrhea.  Genitourinary: Negative for dysuria, hematuria, vaginal bleeding, vaginal discharge and vaginal pain.  All other systems reviewed and are negative.    Physical Exam Updated Vital Signs BP 117/70 (BP Location: Right Arm)   Pulse 66   Temp 97.9 F (36.6 C) (Oral)   Resp 19   SpO2 100%   Physical Exam  Constitutional: She appears well-developed and well-nourished. No distress.  Resting comfortably in bed, no apparent distress.   HENT:  Head: Normocephalic and atraumatic.  Eyes: Pupils are equal, round, and reactive to light. Conjunctivae and EOM are normal. Right eye exhibits no discharge. Left eye exhibits no discharge.  Neck: Normal range of motion. Neck supple. No JVD present. No tracheal deviation present.  Cardiovascular: Normal rate, regular rhythm and normal heart sounds.  Pulmonary/Chest: Effort normal.  Abdominal: Soft. Bowel sounds are normal. She exhibits no distension. There  is tenderness in the epigastric area. There is no rigidity, no rebound, no guarding, no CVA tenderness, no tenderness at McBurney's point and negative Murphy's sign.  Mild epigastric discomfort  Musculoskeletal: She exhibits no edema.  Neurological: She is alert.  Skin: Skin is warm and dry. No erythema.  Psychiatric: She has a normal mood and affect. Her behavior is normal.  Nursing note and vitals reviewed.    ED Treatments / Results   Labs (all labs ordered are listed, but only abnormal results are displayed) Labs Reviewed  COMPREHENSIVE METABOLIC PANEL - Abnormal; Notable for the following components:      Result Value   CO2 20 (*)    Glucose, Bld 111 (*)    All other components within normal limits  CBC - Abnormal; Notable for the following components:   Hemoglobin 11.8 (*)    MCH 24.9 (*)    MCHC 27.9 (*)    RDW 15.6 (*)    All other components within normal limits  LIPASE, BLOOD  I-STAT BETA HCG BLOOD, ED ([facility], [facility], [facility] ONLY)    EKG None  Radiology No results found.  Procedures Procedures (including critical care time)  Medications Ordered in ED Medications  gi cocktail (Maalox,Lidocaine,Donnatal) (30 mLs Oral Given 08/19/18 2250)  sodium chloride 0.9 % bolus 1,000 mL (1,000 mLs Intravenous New Bag/Given 08/19/18 2246)  famotidine (PEPCID) IVPB 20 mg premix (20 mg Intravenous New Bag/Given 08/19/18 2249)     Initial Impression / Assessment and Plan / ED Course  I have reviewed the triage vital signs and the nursing notes.  Pertinent labs & imaging results that were available during my care of the patient were reviewed by me and considered in my medical decision making (see chart for details).     Patient presents for evaluation of hematemesis earlier today after drinking a chocolate milkshake.  She is afebrile, vital signs are stable.  She is nontoxic in appearance.  This is been an ongoing issue for several months and she is following up with gastroenterology.  Her next appointment is tomorrow.  Lab work overall unremarkable with no leukocytosis, mild stable anemia, no metabolic derangements.  No elevated anion gap acidosis noted.  Lipase, creatinine, and LFTs within normal limits.  Patient was unable to give a urine sample though I have a low suspicion of UTI or nephrolithiasis in the absence of urinary symptoms.  Doubt obstruction, perforation, appendicitis, colitis, ovarian torsion, TOA, ectopic  pregnancy, or other acute surgical abdominal pathology in the presence of chronic symptoms. Suspect hematemesis likely secondary to Mallory-Weiss tear and not likely to be due to esophageal varies or Boerhaave.  She was given IV fluids, GI cocktail, and Pepcid notes her symptoms have significantly worsened or improved.  She is tolerating p.o. fluids in the ED.  Serial abdominal examinations remain benign.  Stable for discharge home with follow-up with her gastroneurologist as scheduled tomorrow.  Discussed strict ED return precautions.  I did encourage her to eat a diet of bland foods that would not upset her symptoms.  Discharged with Pepcid and Zofran.  Patient and patient's friend verbalized understanding of and agreement with plan and patient is stable for discharge home at this time.   Final Clinical Impressions(s) / ED Diagnoses   Final diagnoses:  Hematemesis, presence of nausea not specified  Intermittent abdominal pain    ED Discharge Orders         Ordered    famotidine (PEPCID) 20 MG tablet  2 times daily  08/19/18 2317    ondansetron (ZOFRAN ODT) 4 MG disintegrating tablet  Every 8 hours PRN     08/19/18 2317           Jeanie Sewer, PA-C 08/19/18 2346    Maia Plan, MD 08/20/18 [phone]

## 2018-08-19 NOTE — ED Notes (Signed)
Pt. Attempted to use the bathroom to collect urine for U/A , unsuccessful pt. Stated last time went to bathroom was yesterday.

## 2018-08-19 NOTE — ED Triage Notes (Signed)
Pt reports vomiting blood today after drinking a chocolate milkshake. She reports that she thinks that she has "an esophageal hernia." She reports that she has vomited blood in the past, but this time was more than normal. She states that she has a GI appt tomorrow for same. Denies pain now, but states that her throat was hurting earlier.

## 2020-01-08 ENCOUNTER — Ambulatory Visit: Payer: Medicaid Other | Attending: Internal Medicine

## 2020-01-08 DIAGNOSIS — Z23 Encounter for immunization: Secondary | ICD-10-CM | POA: Insufficient documentation

## 2020-01-08 NOTE — Progress Notes (Signed)
   Covid-19 Vaccination Clinic  Name:  Kathleen Lam    MRN: [id number] DOB: 2001-08-07  01/08/2020  [name] was observed post Covid-19 immunization for 15 minutes without incident. She was provided with Vaccine Information Sheet and instruction to access the V-Safe system.   [name] was instructed to call 911 with any severe reactions post vaccine: Marland Kitchen Difficulty breathing  . Swelling of face and throat  . A fast heartbeat  . A bad rash all over body  . Dizziness and weakness   Immunizations Administered    Name Date Dose VIS Date Route   Pfizer COVID-19 Vaccine 01/08/2020  2:28 PM 0.3 mL 10/15/2019 Intramuscular   Manufacturer: [employer], [employer]   Lot: [id number]   NDC: [address]-2

## 2020-01-29 ENCOUNTER — Ambulatory Visit: Payer: Medicaid Other | Attending: Internal Medicine

## 2020-01-29 DIAGNOSIS — Z23 Encounter for immunization: Secondary | ICD-10-CM

## 2020-01-29 NOTE — Progress Notes (Signed)
   Covid-19 Vaccination Clinic  Name:  Micheline Markes    MRN: [id number] DOB: 03-31-2001  01/29/2020  Ms. Nile was observed post Covid-19 immunization for 15 minutes without incident. She was provided with Vaccine Information Sheet and instruction to access the V-Safe system.   Ms. Scroggs was instructed to call 911 with any severe reactions post vaccine: Marland Kitchen Difficulty breathing  . Swelling of face and throat  . A fast heartbeat  . A bad rash all over body  . Dizziness and weakness   Immunizations Administered    Name Date Dose VIS Date Route   Pfizer COVID-19 Vaccine 01/29/2020  2:02 PM 0.3 mL 10/15/2019 Intramuscular   Manufacturer: [employer], [employer]   Lot: [id number]   NDC: [address]-5

## 2020-02-08 ENCOUNTER — Ambulatory Visit: Payer: Medicaid Other

## 2021-03-08 ENCOUNTER — Other Ambulatory Visit: Payer: Self-pay

## 2021-03-08 ENCOUNTER — Emergency Department (HOSPITAL_COMMUNITY)
Admission: EM | Admit: 2021-03-08 | Discharge: 2021-03-08 | Disposition: A | Discharge status: Home / Self Care | Payer: Medicaid Other | Attending: Emergency Medicine | Admitting: Emergency Medicine

## 2021-03-08 ENCOUNTER — Emergency Department (HOSPITAL_COMMUNITY): Payer: Medicaid Other

## 2021-03-08 ENCOUNTER — Encounter (HOSPITAL_COMMUNITY): Payer: Self-pay

## 2021-03-08 DIAGNOSIS — R1011 Right upper quadrant pain: Secondary | ICD-10-CM | POA: Insufficient documentation

## 2021-03-08 DIAGNOSIS — R112 Nausea with vomiting, unspecified: Secondary | ICD-10-CM

## 2021-03-08 DIAGNOSIS — R1110 Vomiting, unspecified: Secondary | ICD-10-CM | POA: Diagnosis not present

## 2021-03-08 LAB — URINALYSIS, ROUTINE W REFLEX MICROSCOPIC
Bacteria, UA: NONE SEEN
Bilirubin Urine: NEGATIVE
Glucose, UA: NEGATIVE mg/dL
Ketones, ur: 20 mg/dL — AB
Leukocytes,Ua: NEGATIVE
Nitrite: NEGATIVE
Protein, ur: 30 mg/dL — AB
Specific Gravity, Urine: >1.046 — ABNORMAL HIGH (ref 1.005–1.030)
pH: 6 (ref 5.0–8.0)

## 2021-03-08 LAB — COMPREHENSIVE METABOLIC PANEL
ALT: 14 U/L (ref 0–44)
AST: 21 U/L (ref 15–41)
Albumin: 4.6 g/dL (ref 3.5–5.0)
Alkaline Phosphatase: 49 U/L (ref 38–126)
Anion gap: 13 (ref 5–15)
BUN: 14 mg/dL (ref 6–20)
CO2: 19 mmol/L — ABNORMAL LOW (ref 22–32)
Calcium: 9.2 mg/dL (ref 8.9–10.3)
Chloride: 106 mmol/L (ref 98–111)
Creatinine, Ser: 0.78 mg/dL (ref 0.44–1.00)
GFR, Estimated: >60 mL/min (ref >60)
Glucose, Bld: 97 mg/dL (ref 70–99)
Potassium: 3.3 mmol/L — ABNORMAL LOW (ref 3.5–5.1)
Sodium: 138 mmol/L (ref 135–145)
Total Bilirubin: 1 mg/dL (ref 0.3–1.2)
Total Protein: 7.9 g/dL (ref 6.5–8.1)

## 2021-03-08 LAB — RAPID URINE DRUG SCREEN, HOSP PERFORMED
Amphetamines: NOT DETECTED
Barbiturates: NOT DETECTED
Benzodiazepines: NOT DETECTED
Cocaine: NOT DETECTED
Opiates: POSITIVE — AB
Tetrahydrocannabinol: NOT DETECTED

## 2021-03-08 LAB — CBC WITH DIFFERENTIAL/PLATELET
Abs Immature Granulocytes: 0.01 10*3/uL (ref 0.00–0.07)
Basophils Absolute: 0 10*3/uL (ref 0.0–0.1)
Basophils Relative: 0 %
Eosinophils Absolute: 0 10*3/uL (ref 0.0–0.5)
Eosinophils Relative: 0 %
HCT: 44.7 % (ref 36.0–46.0)
Hemoglobin: 13.2 g/dL (ref 12.0–15.0)
Immature Granulocytes: 0 %
Lymphocytes Relative: 8 %
Lymphs Abs: 0.4 10*3/uL — ABNORMAL LOW (ref 0.7–4.0)
MCH: 27.3 pg (ref 26.0–34.0)
MCHC: 29.5 g/dL — ABNORMAL LOW (ref 30.0–36.0)
MCV: 92.5 fL (ref 80.0–100.0)
Monocytes Absolute: 0.4 10*3/uL (ref 0.1–1.0)
Monocytes Relative: 8 %
Neutro Abs: 4 10*3/uL (ref 1.7–7.7)
Neutrophils Relative %: 84 %
Platelets: UNDETERMINED 10*3/uL (ref 150–400)
RBC: 4.83 MIL/uL (ref 3.87–5.11)
RDW: 13.5 % (ref 11.5–15.5)
WBC: 4.7 10*3/uL (ref 4.0–10.5)
nRBC: 0 % (ref 0.0–0.2)

## 2021-03-08 LAB — LIPASE, BLOOD: Lipase: 30 U/L (ref 11–51)

## 2021-03-08 LAB — I-STAT BETA HCG BLOOD, ED (MC, WL, AP ONLY): I-stat hCG, quantitative: <5 m[IU]/mL (ref <5)

## 2021-03-08 MED ORDER — SODIUM CHLORIDE 0.9 % IV BOLUS
1000.0000 mL | Freq: Once | INTRAVENOUS | Status: AC
  Administered 2021-03-08: 1000 mL via INTRAVENOUS

## 2021-03-08 MED ORDER — ONDANSETRON 4 MG PO TBDP: ORAL_TABLET | ORAL | 0 refills | Status: AC

## 2021-03-08 MED ORDER — IOHEXOL 300 MG/ML  SOLN
100.0000 mL | Freq: Once | INTRAMUSCULAR | Status: AC | PRN
  Administered 2021-03-08: 100 mL via INTRAVENOUS

## 2021-03-08 MED ORDER — ONDANSETRON HCL 4 MG/2ML IJ SOLN
4.0000 mg | Freq: Once | INTRAMUSCULAR | Status: AC
  Administered 2021-03-08: 4 mg via INTRAVENOUS
  Filled 2021-03-08: qty 2

## 2021-03-08 MED ORDER — MORPHINE SULFATE (PF) 4 MG/ML IV SOLN
4.0000 mg | Freq: Once | INTRAVENOUS | Status: AC
  Administered 2021-03-08: 4 mg via INTRAVENOUS
  Filled 2021-03-08: qty 1

## 2021-03-08 NOTE — ED Notes (Signed)
Pt states she was able to keep down fluids provided by previous nurse. Pt denies any nausea or pain at this time.

## 2021-03-08 NOTE — ED Provider Notes (Signed)
Emergency Medicine Provider Triage Evaluation Note  Kathleen Lam , a 20 y.o. female  was evaluated in triage.  Pt complains of right upper quadrant pain.  Pain began last night at 8:30pm.  Pain started 2.5hours after eating.  Pain has been constant but waxes and wanes in intensity.  Has had associated nausea and vomiting, was vomited approximately 50 times in the last 24 hours.  Emesis is described as bilious, non bloody.  Unable to keep dopwn any PO intake.  No history of abdominal surgeries  LMP started today  Review of Systems  Positive: Chils, RUQ abd pain, nausea, vomiting, decreased urine Negative:  fever, dysuria, hematuria,   Physical Exam  BP 122/84 (BP Location: Left Arm)   Pulse (!) 108   Temp 99.8 F (37.7 C) (Oral)   Resp 18   Ht 5\' 4"  (1.626 m)   Wt 75.3 kg   LMP 03/08/2021   SpO2 100%   BMI 28.49 kg/m  Gen:   Awake, no distress   Resp:  Normal effort  MSK:   Moves extremities without difficulty  Other:  Abdomen soft, nondistended, minimal tenderness to right upper quadrant, positive right CVA tenderness  Medical Decision Making  Medically screening exam initiated at 1:30 PM.  Appropriate orders placed.  Kathleen Lam was informed that the remainder of the evaluation will be completed by another provider, this initial triage assessment does not replace that evaluation, and the importance of remaining in the ED until their evaluation is complete.  The patient appears stable so that the remainder of the work up may be completed by another provider.      Lysle Dingwall, PA-C 03/08/21 1337    05/08/21, MD 03/10/21 1534

## 2021-03-08 NOTE — Discharge Instructions (Addendum)
Stay hydrated.  Take Zofran for nausea  Ultrasound and CT were unremarkable  See your doctor for follow-up  Return to ER if you have worse vomiting, abdominal pain, dehydration

## 2021-03-08 NOTE — ED Provider Notes (Signed)
[facility]-EMERGENCY DEPT Provider Note   CSN: [id number] Arrival date & time: 03/08/21  1237     History Chief Complaint  Patient presents with  . Abdominal Pain  . Emesis    Kathleen Lam is a 20 y.o. female here presenting with abdominal pain and vomiting.  Patient has acute onset of right upper quadrant pain this morning.  She states that she has over 50 times of vomiting.  She went to urgent care was given Zofran and was sent here for possible acute cholecystitis.  She denies any previous abdominal surgeries.   The history is provided by the patient.       Past Medical History:  Diagnosis Date  . Influenza     There are no problems to display for this patient.   History reviewed. No pertinent surgical history.   OB History   No obstetric history on file.     Family History  Problem Relation Age of Onset  . Healthy Mother   . Healthy Father     Social History   Tobacco Use  . Smoking status: Never Smoker  . Smokeless tobacco: Never Used  Substance Use Topics  . Alcohol use: No  . Drug use: No    Home Medications Prior to Admission medications   Medication Sig Start Date End Date Taking? Authorizing Provider  famotidine (PEPCID) 20 MG tablet Take 1 tablet (20 mg total) by mouth 2 (two) times daily. 08/19/18   Fawze, Mina A, PA-C  Multiple Vitamin (MULTIVITAMIN WITH MINERALS) TABS tablet Take 1 tablet by mouth daily.    [provider]  ondansetron (ZOFRAN ODT) 4 MG disintegrating tablet Take 1 tablet (4 mg total) by mouth every 8 (eight) hours as needed for nausea or vomiting. 08/19/18   Michela Pitcher A, PA-C    Allergies    Patient has no known allergies.  Review of Systems   Review of Systems  Gastrointestinal: Positive for abdominal pain and vomiting.  All other systems reviewed and are negative.   Physical Exam Updated Vital Signs BP 112/82   Pulse 90   Temp 99.8 F (37.7 C) (Oral)   Resp 20   Ht 5\' 4"   (1.626 m)   Wt 75.3 kg   LMP 03/08/2021   SpO2 100%   BMI 28.49 kg/m   Physical Exam Vitals and nursing note reviewed.  Constitutional:      Comments: Dehydrated, uncomfortable  HENT:     Head: Normocephalic.     Mouth/Throat:     Pharynx: Oropharynx is clear.  Eyes:     Extraocular Movements: Extraocular movements intact.     Pupils: Pupils are equal, round, and reactive to light.  Cardiovascular:     Rate and Rhythm: Normal rate and regular rhythm.  Abdominal:     Comments: + Epigastric and right upper quadrant tenderness   Skin:    General: Skin is warm.  Neurological:     General: No focal deficit present.  Psychiatric:        Mood and Affect: Mood normal.        Behavior: Behavior normal.     ED Results / Procedures / Treatments   Labs (all labs ordered are listed, but only abnormal results are displayed) Labs Reviewed  CBC WITH DIFFERENTIAL/PLATELET - Abnormal; Notable for the following components:      Result Value   MCHC 29.5 (*)    Lymphs Abs 0.4 (*)    All other components within normal  limits  COMPREHENSIVE METABOLIC PANEL - Abnormal; Notable for the following components:   Potassium 3.3 (*)    CO2 19 (*)    All other components within normal limits  LIPASE, BLOOD  URINALYSIS, ROUTINE W REFLEX MICROSCOPIC  I-STAT BETA HCG BLOOD, ED ([facility], [facility], [facility] ONLY)    EKG None  Radiology US Abdomen Limited RUQ (LIVER/GB)  Result Date: 03/08/2021 CLINICAL DATA:  Right upper quadrant pain EXAM: ULTRASOUND ABDOMEN LIMITED RIGHT UPPER QUADRANT COMPARISON:  None. FINDINGS: Gallbladder: No gallstones or wall thickening visualized. No sonographic Murphy sign noted by sonographer. Common bile duct: Diameter: 4.9 mm Liver: No focal lesion identified. Within normal limits in parenchymal echogenicity. Portal vein is patent on color Doppler imaging with normal direction of blood flow towards the liver. Other: None. IMPRESSION: Unremarkable right upper quadrant ultrasound.  Electronically Signed   By: Caprice Renshaw   On: 03/08/2021 16:37    Procedures Procedures   Medications Ordered in ED Medications  sodium chloride 0.9 % bolus 1,000 mL (1,000 mLs Intravenous New Bag/Given 03/08/21 1608)  ondansetron (ZOFRAN) injection 4 mg (4 mg Intravenous Given 03/08/21 1608)  morphine 4 MG/ML injection 4 mg (4 mg Intravenous Given 03/08/21 1609)    ED Course  I have reviewed the triage vital signs and the nursing notes.  Pertinent labs & imaging results that were available during my care of the patient were reviewed by me and considered in my medical decision making (see chart for details).    MDM Rules/Calculators/A&P                          Kathleen Lam is a 19 y.o. female here presenting with abdominal pain and vomiting.  Likely viral gastro versus cholecystitis.  Will get right upper quadrant ultrasound and will get CBC and CMP and lipase.  Will hydrate and reassess.  7:29 PM Labs unremarkable.  Right upper quadrant ultrasound unremarkable.  She still had pain so CT abdomen pelvis performed was unremarkable.  Given IV fluids and Zofran and felt better. Stable for discharge home.  Likely viral gastro.  Final Clinical Impression(s) / ED Diagnoses Final diagnoses:  RUQ pain    Rx / DC Orders ED Discharge Orders    None       Charlynne Pander, MD 03/08/21 1931

## 2021-03-08 NOTE — ED Notes (Signed)
Ginger ale provided.

## 2021-03-08 NOTE — ED Triage Notes (Signed)
Right sided abd pain with vomiting/diarrhea/chills/body aches x1 day. Patient went to urgent care and was sent to ED to r/o cholecystitis.

## 2022-05-21 IMAGING — CT CT ABD-PELV W/ CM
2 of 4 series · 17 of 46 positions shown, 19 images · IV contrast (omnipaque)
Comparison: None

CLINICAL DATA: Epigastric and RIGHT upper quadrant pain with nausea
and vomiting

EXAM:
CT ABDOMEN AND PELVIS WITH CONTRAST
TECHNIQUE: Multidetector CT imaging of the abdomen and pelvis was performed
using the standard protocol following bolus administration of
intravenous contrast. Sagittal and coronal MPR images reconstructed
from axial data set.
CONTRAST:  100mL OMNIPAQUE IOHEXOL 300 MG/ML SOLN IV. No oral
contrast.

[Series 2: axial st · axial · 0.77mm/px · z∈[+1052,+1492]mm · 14 of 100 slices shown, 16 images]
[im 6/100  soft-tissue]
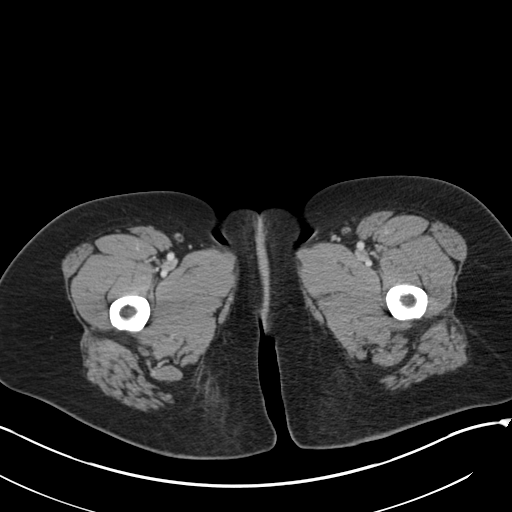
[im 6/100  bone]
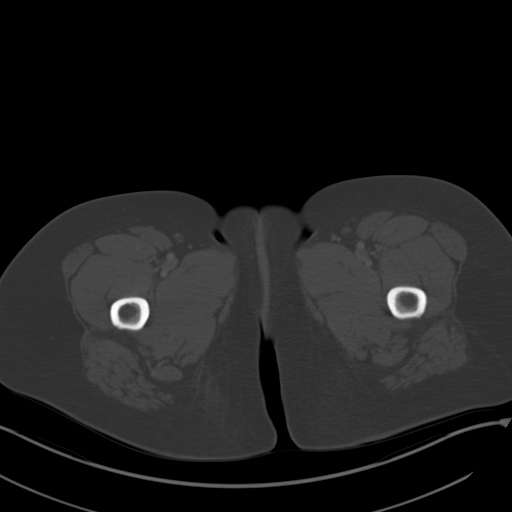
[im 12/100  soft-tissue]
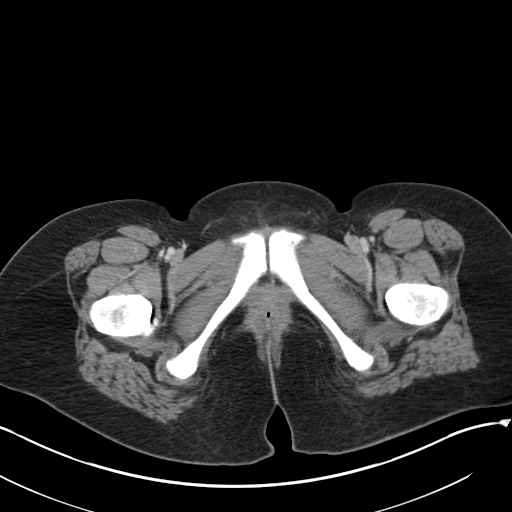
[im 18/100  soft-tissue]
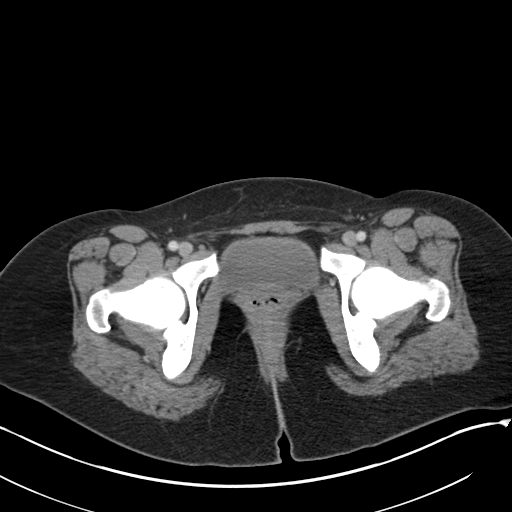
[im 30/100  soft-tissue]
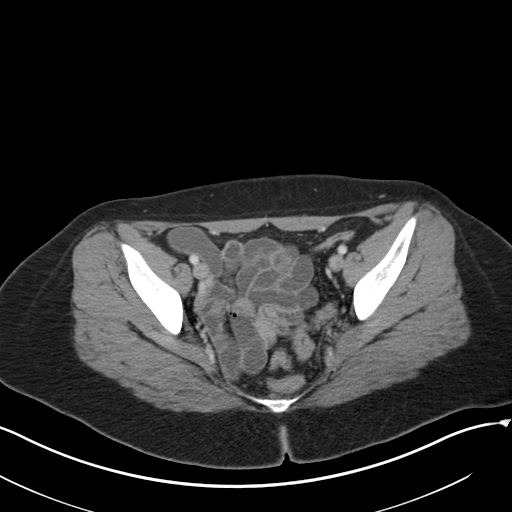
[im 35/100  soft-tissue]
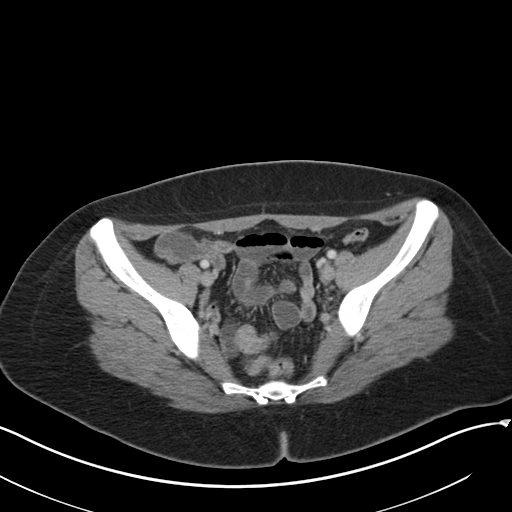
[im 41/100  soft-tissue]
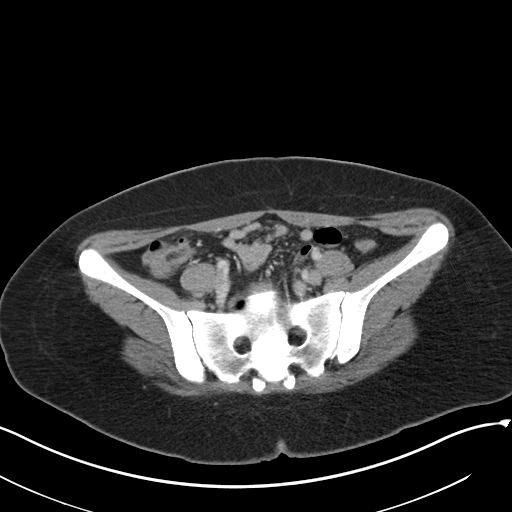
[im 47/100  soft-tissue]
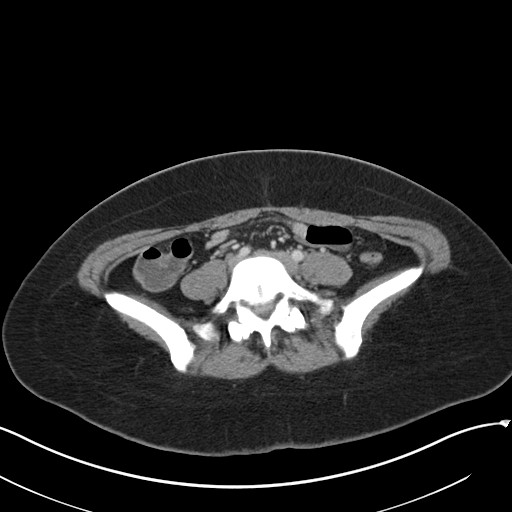
[im 53/100  soft-tissue]
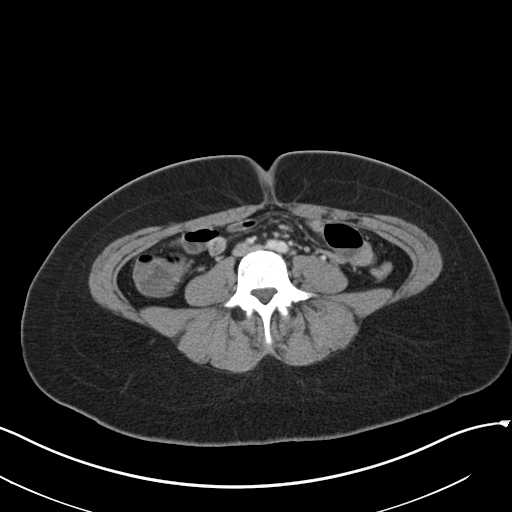
[im 59/100  soft-tissue]
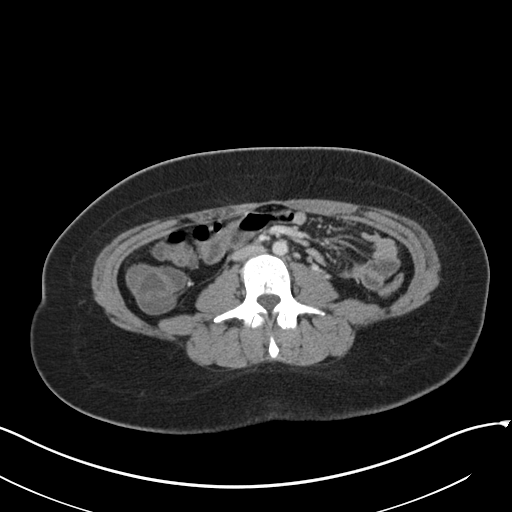
[im 59/100  bone]
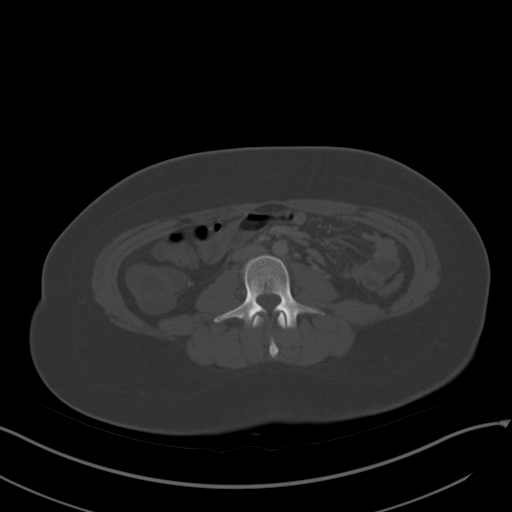
[im 65/100  soft-tissue]
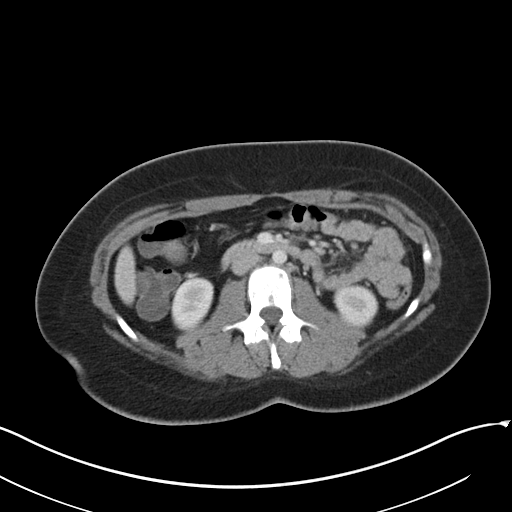
[im 76/100  soft-tissue]
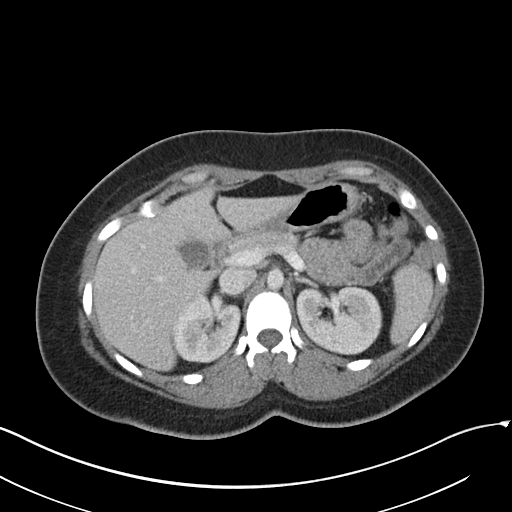
[im 82/100  soft-tissue]
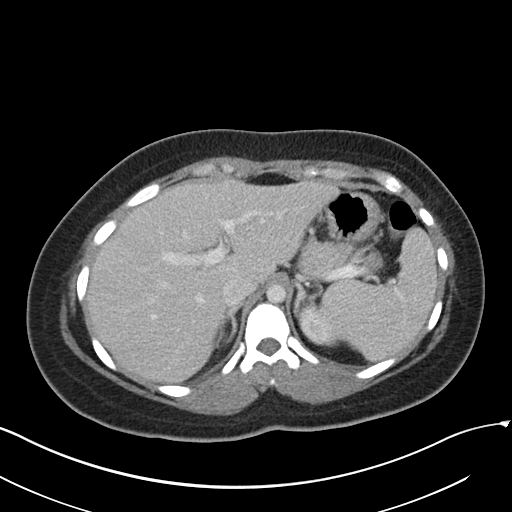
[im 88/100  soft-tissue]
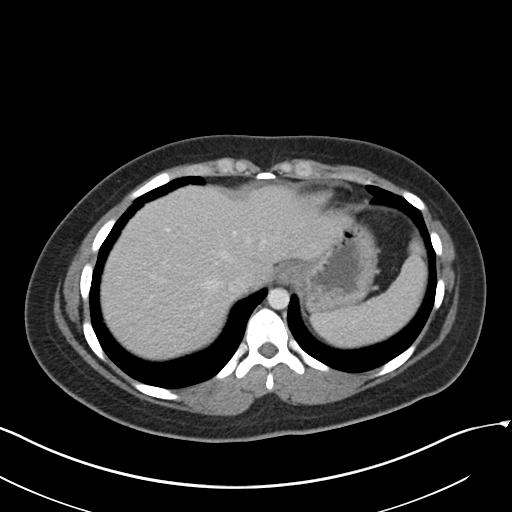
[im 94/100  soft-tissue]
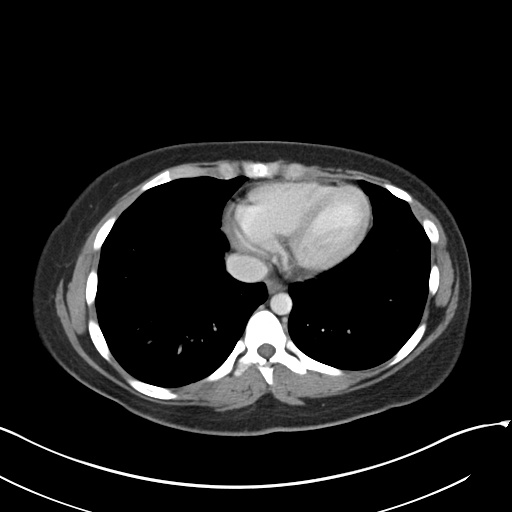

[Series 5: coronal st · coronal · 0.95mm/px · 3 of 131 slices shown]
[im 44/131  soft-tissue]
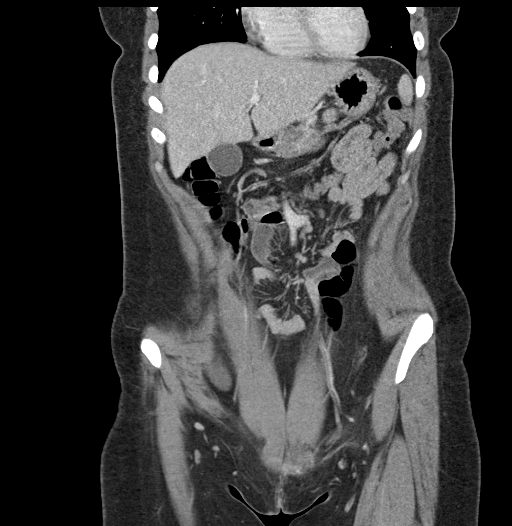
[im 58/131  soft-tissue]
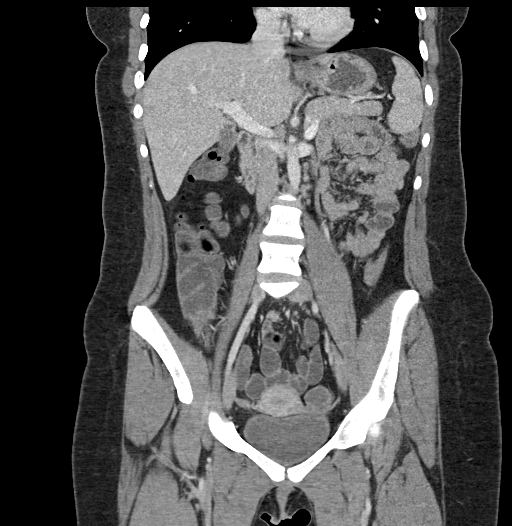
[im 73/131  soft-tissue]
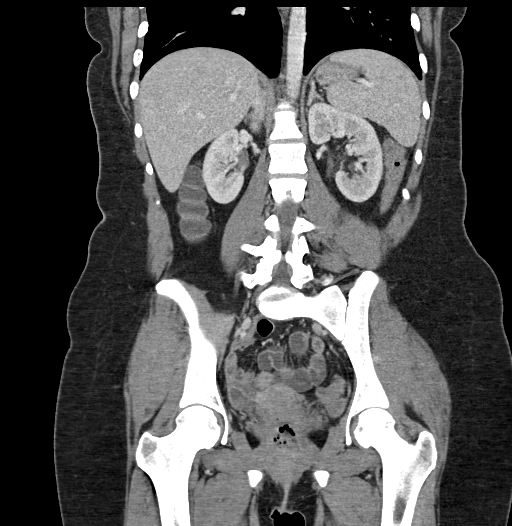

[17 of 46 positions shown; findings below may reference images not displayed]

FINDINGS: Lower chest: Lung bases clear

Hepatobiliary: Liver and gallbladder normal appearance

Pancreas: Normal appearance

Spleen: Normal appearance

Adrenals/Urinary Tract: Tiny nonobstructing LEFT renal calculus.
Adrenal glands, kidneys, ureters, and bladder otherwise normal
appearance

Stomach/Bowel: Appendix not visualized but no pericecal inflammatory
process seen. Stomach and bowel loops normal appearance

Vascular/Lymphatic: Vascular structures patent. Few scattered normal
size lymph nodes without adenopathy.

Reproductive: Unremarkable uterus and adnexa for age

Other: No free air or free fluid. No hernia or inflammatory process.

Musculoskeletal: BILATERAL spondylolysis L5 with minimal
anterolisthesis
IMPRESSION: No acute intra-abdominal or intrapelvic abnormalities.

Tiny nonobstructing LEFT renal calculus.

BILATERAL spondylolysis L5 with minimal anterolisthesis.

## 2022-10-07 ENCOUNTER — Other Ambulatory Visit: Payer: Medicaid Other

## 2022-10-24 ENCOUNTER — Other Ambulatory Visit: Payer: Self-pay | Admitting: *Deleted

## 2022-10-24 DIAGNOSIS — R7989 Other specified abnormal findings of blood chemistry: Secondary | ICD-10-CM

## 2022-10-29 NOTE — Progress Notes (Signed)
New Hematology/Oncology Consult   Requesting MD: Lenise Herald, [place]  [phone]      Reason for Consult: Evaluate low lymphocyte percentage  HPI: Kathleen Lam is a 21 year old female referred for evaluation of a low lymphocyte percentage in the setting of fatigue.  Most recent CBC in the EMR is from 09/06/2022-hemoglobin 13.1, white count 8.3, absolute lymphocytes 1.3 (1.0-4.5), absolute neutrophil count 6.6, platelet count 351,000.  CBC 07/10/2022-hemoglobin 12.2, white count 6.5, absolute lymphocyte count 1.2 (1.0-4.5), ANC 4.5, platelet count 294,000; CBC 05/09/2022 hemoglobin 12.5, white count 5.7, absolute lymphocyte count 1.3 (1.0-4.5), absolute neutrophil count 4.0, platelet count 338,000.  CBC 03/08/2021 hemoglobin and total white count normal, platelet clumps, absolute lymphocytes low at 0.4.  She developed fatigue and "brain fog" in June of this year.  She reports iron level was found to be low as well as lymphocytes.  She traveled to [place] in September of this year.  On the flight back she developed nausea/vomiting/diarrhea.  The diarrhea lasted about 3 weeks.  Bowels now alternating diarrhea and constipation.  She reports stool studies were negative.  She continues to have "random" vomiting episodes.  She thinks a GI referral is planned.  She has periodic pain at the right low abdomen.  She had CT scans abdomen/pelvis at [facility] 07/10/2022 to evaluate dysuria and lower back pain.  There were no acute findings.  A tiny nonobstructing left renal stone was noted.  She denies fever.  She has intermittent episodes of sweats that occur mainly during the day.  Appetite is poor.  She has not lost any weight.  No bleeding except hemorrhoidal if she is constipated.  She notes that cervical lymph nodes are periodically swollen.  No enlarged lymph nodes at present.  She denies shortness of breath.  No cough.  No chest pain.  No leg swelling or calf pain.  No unusual rash.  No joint pain or  swelling.   Past Medical History:  Diagnosis Date   Influenza     History reviewed. No pertinent surgical history.:   Current Outpatient Medications:    cyanocobalamin (VITAMIN B12) 1000 MCG tablet, Take by mouth., Disp: , Rfl:    ergocalciferol (VITAMIN D2) 1.25 MG (50000 UT) capsule, Take 50,000 Units by mouth once a week., Disp: , Rfl:    Ferric Maltol 30 MG CAPS, Take by mouth., Disp: , Rfl:    Multiple Vitamin (MULTIVITAMIN WITH MINERALS) TABS tablet, Take 1 tablet by mouth daily., Disp: , Rfl:    ondansetron (ZOFRAN ODT) 4 MG disintegrating tablet, 4mg  ODT q4 hours prn nausea/vomit, Disp: 10 tablet, Rfl: 0   famotidine (PEPCID) 20 MG tablet, Take 1 tablet (20 mg total) by mouth 2 (two) times daily. (Patient not taking: Reported on 10/30/2022), Disp: 30 tablet, Rfl: 0:    No Known Allergies:  FH: Maternal grandmother with liver disease, pancreas cancer, hypertension, stroke, rheumatoid arthritis, atrial fibrillation, diabetes.  Mother with borderline personality disorder, father with schizophrenia.  SOCIAL HISTORY: She lives in [place].  She is a Social worker.  No tobacco use.  She estimates 3-4 alcoholic drinks per week.  She has never had a blood transfusion.  She denies any risk factors for HIV or hepatitis.  Review of Systems:  She denies fever.  She has intermittent episodes of sweats that occur mainly during the day.  Appetite is poor.  She has not lost any weight.  No bleeding except hemorrhoidal if she is constipated.  She notes that cervical lymph nodes are periodically swollen.  No enlarged lymph nodes at present.  She denies shortness of breath.  No cough.  No chest pain.  No leg swelling or calf pain.  No unusual rash.  No joint pain or swelling.  Intermittent tingling and redness at the fingertips.  Physical Exam:  Blood pressure 121/81, pulse 72, temperature 97.9 F (36.6 C), temperature source Oral, resp. rate 18, height 5\' 4"  (1.626 m), weight 196 lb (88.9 kg), SpO2  100 %.  HEENT: No thrush or ulcers. Lungs: Lungs clear bilaterally. Cardiac: Regular rate and rhythm. Abdomen: Abdomen soft and nontender.  No hepatosplenomegaly. Vascular: No leg edema. Lymph nodes: No palpable cervical, supraclavicular, axillary or inguinal lymph nodes. Neurologic: Alert and oriented. Skin: No rash.   LABS:   Recent Labs    10/30/22 1033  WBC 7.0  HGB 13.5  HCT 40.3  PLT 365  Peripheral blood smear-white blood cells - majority of the white blood cells are mature appearing lymphocytes and neutrophils, no monotonous population seen, some of the neutrophils appear to have toxic granulations, platelets normal in number; red cell morphology unremarkable, polychromasia not increased.  No results for input(s): "NA", "K", "CL", "CO2", "GLUCOSE", "BUN", "CREATININE", "CALCIUM" in the last 72 hours.    RADIOLOGY:  No results found.  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 Dils was referred for evaluation of low lymphocytes.  The CBC is normal today.  The peripheral blood smear is unremarkable.  She developed acute GI symptoms following international travel in September of this year.  She has a persistent change in bowel habits and intermittent episodes of vomiting.  Referral made to gastroenterology.  We did not schedule additional follow-up in the hematology clinic but are available to see her in the future if needed.  Patient seen with Dr. Truett Perna.    Lonna Cobb, NP 10/30/2022, 11:02 AM This was a shared visit with Lonna Cobb.  Ms. Landsverk was interviewed and examined.  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  The lymphocyte count has been normal on recent CBC determinations and is normal again today.  She developed a GI illness upon return from a trip to [place].  She continues to have  intermittent nausea and diarrhea/constipation.  The peripheral blood smear findings are suggestive of a possible systemic inflammatory process.  I have a low clinical suspicion for a primary hematologic diagnosis.  I recommend she continue follow-up with her primary provider and schedule a gastroenterology consult.  We are available to see her if she develops a new hematologic abnormality, and as needed.  I was present for greater than 50% of today's visit.  I performed medical decision making.  Mancel Bale, MD

## 2022-10-30 ENCOUNTER — Inpatient Hospital Stay: Payer: Medicaid Other | Admitting: Nurse Practitioner

## 2022-10-30 ENCOUNTER — Inpatient Hospital Stay: Payer: Medicaid Other | Attending: Oncology

## 2022-10-30 ENCOUNTER — Encounter: Payer: Self-pay | Admitting: Nurse Practitioner

## 2022-10-30 VITALS — BP 121/81 | HR 72 | Temp 97.9°F | Resp 18 | Ht 64.0 in | Wt 196.0 lb

## 2022-10-30 DIAGNOSIS — R197 Diarrhea, unspecified: Secondary | ICD-10-CM | POA: Insufficient documentation

## 2022-10-30 DIAGNOSIS — R7989 Other specified abnormal findings of blood chemistry: Secondary | ICD-10-CM

## 2022-10-30 DIAGNOSIS — K5900 Constipation, unspecified: Secondary | ICD-10-CM | POA: Insufficient documentation

## 2022-10-30 DIAGNOSIS — R110 Nausea: Secondary | ICD-10-CM | POA: Diagnosis not present

## 2022-10-30 DIAGNOSIS — Z800 Family history of malignant neoplasm of digestive organs: Secondary | ICD-10-CM | POA: Diagnosis not present

## 2022-10-30 DIAGNOSIS — D72810 Lymphocytopenia: Secondary | ICD-10-CM | POA: Diagnosis not present

## 2022-10-30 LAB — CBC WITH DIFFERENTIAL (CANCER CENTER ONLY)
Abs Immature Granulocytes: 0.03 10*3/uL (ref 0.00–0.07)
Basophils Absolute: 0 10*3/uL (ref 0.0–0.1)
Basophils Relative: 1 %
Eosinophils Absolute: 0.2 10*3/uL (ref 0.0–0.5)
Eosinophils Relative: 2 %
HCT: 40.3 % (ref 36.0–46.0)
Hemoglobin: 13.5 g/dL (ref 12.0–15.0)
Immature Granulocytes: 0 %
Lymphocytes Relative: 31 %
Lymphs Abs: 2.2 10*3/uL (ref 0.7–4.0)
MCH: 28.1 pg (ref 26.0–34.0)
MCHC: 33.5 g/dL (ref 30.0–36.0)
MCV: 84 fL (ref 80.0–100.0)
Monocytes Absolute: 0.4 10*3/uL (ref 0.1–1.0)
Monocytes Relative: 6 %
Neutro Abs: 4.2 10*3/uL (ref 1.7–7.7)
Neutrophils Relative %: 60 %
Platelet Count: 365 10*3/uL (ref 150–400)
RBC: 4.8 MIL/uL (ref 3.87–5.11)
RDW: 13.2 % (ref 11.5–15.5)
WBC Count: 7 10*3/uL (ref 4.0–10.5)
nRBC: 0 % (ref 0.0–0.2)

## 2022-10-30 LAB — SAVE SMEAR(SSMR), FOR PROVIDER SLIDE REVIEW

## 2022-10-30 NOTE — Progress Notes (Unsigned)
Referral for [facility] faxed to [phone]

## 2022-11-14 ENCOUNTER — Telehealth: Payer: Self-pay | Admitting: *Deleted

## 2022-11-14 NOTE — Telephone Encounter (Signed)
Unable to reach patient regarding referral to [facility]. Sent MyChart message.
